# Patient Record
Sex: Male | Born: 2000 | Race: Black or African American | Hispanic: No | Marital: Single | Smoking: Never smoker
Health system: Southern US, Community
[De-identification: ages and names within clinical notes are randomized; demographics above are authoritative.]

## PROBLEM LIST (undated history)

## (undated) ENCOUNTER — Emergency Department (HOSPITAL_COMMUNITY): Admission: EM | Payer: Medicaid Other | Source: Home / Self Care

---
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
## 2020-01-10 ENCOUNTER — Ambulatory Visit: Payer: Medicaid Other | Admitting: Family Medicine

## 2020-01-22 ENCOUNTER — Ambulatory Visit: Payer: Medicaid Other | Admitting: Family Medicine

## 2020-05-17 ENCOUNTER — Encounter: Payer: Self-pay | Admitting: Family Medicine

## 2020-05-17 ENCOUNTER — Ambulatory Visit (INDEPENDENT_AMBULATORY_CARE_PROVIDER_SITE_OTHER): Payer: Medicaid Other | Admitting: Family Medicine

## 2020-05-17 ENCOUNTER — Other Ambulatory Visit: Payer: Self-pay

## 2020-05-17 VITALS — BP 100/65 | HR 64 | Ht 67.0 in | Wt 166.0 lb

## 2020-05-17 DIAGNOSIS — Z0000 Encounter for general adult medical examination without abnormal findings: Secondary | ICD-10-CM | POA: Diagnosis not present

## 2020-05-17 DIAGNOSIS — Z1159 Encounter for screening for other viral diseases: Secondary | ICD-10-CM

## 2020-05-17 DIAGNOSIS — J4520 Mild intermittent asthma, uncomplicated: Secondary | ICD-10-CM

## 2020-05-17 DIAGNOSIS — J302 Other seasonal allergic rhinitis: Secondary | ICD-10-CM

## 2020-05-17 DIAGNOSIS — Z114 Encounter for screening for human immunodeficiency virus [HIV]: Secondary | ICD-10-CM | POA: Diagnosis not present

## 2020-05-17 MED ORDER — FLUTICASONE PROPIONATE 50 MCG/ACT NA SUSP: 2.0000 | Freq: Every day | NASAL | 2 refills | Status: DC

## 2020-05-17 MED ORDER — MONTELUKAST SODIUM 10 MG PO TABS: 10.0000 mg | ORAL_TABLET | Freq: Every day | ORAL | 3 refills | Status: DC

## 2020-05-17 MED ORDER — ALBUTEROL SULFATE HFA 108 (90 BASE) MCG/ACT IN AERS: 2.0000 | INHALATION_SPRAY | Freq: Four times a day (QID) | RESPIRATORY_TRACT | 1 refills | Status: DC | PRN

## 2020-05-17 NOTE — Patient Instructions (Signed)
It was a pleasure to see you today!  Thank you for choosing [facility] for your primary care.  Philip Joyce was seen for seasonal allergies and medication refills .   Our plans for today were:  I have refilled your medications for your allergies. Please use daily to help control your symptoms.   I have refilled your albuterol to be used as needed. I am glad to hear your asthma symptoms have improved.   To keep you healthy, please keep in mind the following health maintenance items that you are due for:   1. Tetanus booster vaccine    You should return to our clinic as needed.   Best Wishes,   Dr. Neita Garnet     Allergic Rhinitis, Adult Allergic rhinitis is a reaction to allergens in the air. Allergens are tiny specks (particles) in the air that cause your body to have an allergic reaction. This condition cannot be passed from person to person (is not contagious). Allergic rhinitis cannot be cured, but it can be controlled. There are two types of allergic rhinitis:  Seasonal. This type is also called hay fever. It happens only during certain times of the year.  Perennial. This type can happen at any time of the year. What are the causes? This condition may be caused by:  Pollen from grasses, trees, and weeds.  House dust mites.  Pet dander.  Mold. What are the signs or symptoms? Symptoms of this condition include:  Sneezing.  Runny or stuffy nose (nasal congestion).  A lot of mucus in the back of the throat (postnasal drip).  Itchy nose.  Tearing of the eyes.  Trouble sleeping.  Being sleepy during day. How is this treated? There is no cure for this condition. You should avoid things that trigger your symptoms (allergens). Treatment can help to relieve symptoms. This may include:  Medicines that block allergy symptoms, such as antihistamines. These may be given as a shot, nasal spray, or pill.  Shots that are given until your body  becomes less sensitive to the allergen (desensitization).  Stronger medicines, if all other treatments have not worked. Follow these instructions at home: Avoiding allergens   Find out what you are allergic to. Common allergens include smoke, dust, and pollen.  Avoid them if you can. These are some of the things that you can do to avoid allergens: ? Replace carpet with wood, tile, or vinyl flooring. Carpet can trap dander and dust. ? Clean any mold found in the home. ? Do not smoke. Do not allow smoking in your home. ? Change your heating and air conditioning filter at least once a month. ? During allergy season:  Keep windows closed as much as you can. If possible, use air conditioning when there is a lot of pollen in the air.  Use a special filter for allergies with your furnace and air conditioner.  Plan outdoor activities when pollen counts are lowest. This is usually during the early morning or evening hours.  If you do go outdoors when pollen count is high, wear a special mask for people with allergies.  When you come indoors, take a shower and change your clothes before sitting on furniture or bedding. General instructions  Do not use fans in your home.  Do not hang clothes outside to dry.  Wear sunglasses to keep pollen out of your eyes.  Wash your hands right away after you touch household pets.  Take over-the-counter and prescription medicines only as  told by your doctor.  Keep all follow-up visits as told by your doctor. This is important. Contact a doctor if:  You have a fever.  You have a cough that does not go away (is persistent).  You start to make whistling sounds when you breathe (wheeze).  Your symptoms do not get better with treatment.  You have thick fluid coming from your nose.  You start to have nosebleeds. Get help right away if:  Your tongue or your lips are swollen.  You have trouble breathing.  You feel dizzy or you feel like you are  going to pass out (faint).  You have cold sweats. Summary  Allergic rhinitis is a reaction to allergens in the air.  This condition may be caused by allergens. These include pollen, dust mites, pet dander, and mold.  Symptoms include a runny, itchy nose, sneezing, or tearing eyes. You may also have trouble sleeping or feel sleepy during the day.  Treatment includes taking medicines and avoiding allergens. You may also get shots or take stronger medicines.  Get help if you have a fever or a cough that does not stop. Get help right away if you are short of breath. This information is not intended to replace advice given to you by your health care provider. Make sure you discuss any questions you have with your health care provider. Document Revised: 01/24/2019 Document Reviewed: 04/26/2018 Elsevier Patient Education  2020 [employer].

## 2020-05-17 NOTE — Progress Notes (Signed)
    SUBJECTIVE:   CHIEF COMPLAINT / HPI: allergies   Seasonal Allergies Patient states that he is experiencing an increase of symptoms of his normal seasonal allergies.  He reports that since the springtime, he has been having itchy eyes, increased nasal congestion and some sneezing.  He reports that he has not been taking allergy medications as previously prescribed.  He reports that he usually uses this medication when he has symptoms.  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  Asthma, mild, intermittent Patient reports that his asthma has been well controlled.  He reports that he normally uses albuterol inhaler as needed when he is planning to do vigorous exercise.  Patient denies any shortness of breath, chest pain, or cough at this time.  Health maintenance Counseled patient on screening test for hepatitis C and HIV.  Patient was agreeable to the screening test.  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 Rhythm is regular. No murmurs or rubs. Pulm: Clear to auscultation bilaterally, no crackles, wheezing, or diminished breath sounds. Normal respiratory effort Abdomen: Bowel sounds normal. Abdomen soft and non-tender.   ASSESSMENT/PLAN:   Asthma, well controlled, mild intermittent Refill prescription for albuterol.  Patient denies nighttime cough or need for albuterol outside of intense exercise.  Patient denies any recent emergency department visit for asthma symptoms.  Does not have any chest pain or shortness of breath during his visit -Patient to continue as  needed albuterol  Seasonal allergies Symptoms consistent with seasonal allergies given patient's report of timing of symptoms. -Refill Flonase -Refill Singulair prescription  Healthcare maintenance -Hepatitis C -HIV screening   Patient to follow-up as needed  , MD [facility]

## 2020-05-18 LAB — HIV ANTIBODY (ROUTINE TESTING W REFLEX): HIV Screen 4th Generation wRfx: NONREACTIVE

## 2020-05-18 LAB — HEPATITIS C ANTIBODY: Hep C Virus Ab: <0.1 s/co ratio (ref 0.0–0.9)

## 2020-05-20 ENCOUNTER — Encounter: Payer: Self-pay | Admitting: Family Medicine

## 2020-05-20 DIAGNOSIS — Z0000 Encounter for general adult medical examination without abnormal findings: Secondary | ICD-10-CM | POA: Insufficient documentation

## 2020-05-20 DIAGNOSIS — J302 Other seasonal allergic rhinitis: Secondary | ICD-10-CM | POA: Insufficient documentation

## 2020-05-20 NOTE — Assessment & Plan Note (Signed)
Symptoms consistent with seasonal allergies given patient's report of timing of symptoms. -Refill Flonase -Refill Singulair prescription

## 2020-05-20 NOTE — Assessment & Plan Note (Signed)
-  Hepatitis C -HIV screening

## 2020-05-20 NOTE — Assessment & Plan Note (Signed)
Refill prescription for albuterol.  Patient denies nighttime cough or need for albuterol outside of intense exercise.  Patient denies any recent emergency department visit for asthma symptoms.  Does not have any chest pain or shortness of breath during his visit -Patient to continue as needed albuterol

## 2020-09-18 DIAGNOSIS — Z03818 Encounter for observation for suspected exposure to other biological agents ruled out: Secondary | ICD-10-CM | POA: Diagnosis not present

## 2020-10-28 DIAGNOSIS — Z03818 Encounter for observation for suspected exposure to other biological agents ruled out: Secondary | ICD-10-CM | POA: Diagnosis not present

## 2020-12-31 DIAGNOSIS — Z03818 Encounter for observation for suspected exposure to other biological agents ruled out: Secondary | ICD-10-CM | POA: Diagnosis not present

## 2021-06-05 DIAGNOSIS — H5213 Myopia, bilateral: Secondary | ICD-10-CM | POA: Diagnosis not present

## 2021-06-18 NOTE — Progress Notes (Deleted)
    SUBJECTIVE:   CHIEF COMPLAINT / HPI: Physical  Annual Examination   I reviewed the following patient responses on our Physical Exam Form Tobacco use  Alcohol Use  Weight  Exercise  Risk for STI  Increased family cancer risk Violence risk  PHQ9 score reviewed  Blood pressure reviewed  I considered the following items based upon USPSTF recommendations: HIV testing: Neg 2021 Hepatitis C testing: neg 2021 Cholesterol screening STI screening if high risk (Hepatitis B, Syphilis, Gonorrhea, Chlamydia) Immunizations - Influenza, Covid, Tetanus   PERTINENT  PMH / PSH:  Hidradenitis suppurativa Allergic rhinitis Asthma, mild intermittent, well-controlled  OBJECTIVE:   There were no vitals taken for this visit.  General: *** appearing stated age in no acute distress HEENT: MMM, no oral lesions noted,Neck non-tender without lymphadenopathy Cardio: Normal S1 and S2, no S3 or S4. Rhythm is regular. No murmurs or rubs.  Bilateral radial pulses palpable Pulm: Clear to auscultation bilaterally, no crackles, wheezing, or diminished breath sounds. Normal respiratory effort Abdomen: Bowel sounds normal. Abdomen soft and non-tender.  Extremities: No peripheral edema. Warm & well perfused.  Neuro: pt alert and oriented x4, follows commands, PERRLA, EOMI bilaterally  ASSESSMENT/PLAN:   No problem-specific Assessment & Plan notes found for this encounter.     Ronnald Ramp, MD [facility]

## 2021-06-19 ENCOUNTER — Encounter: Payer: Medicaid Other | Admitting: Family Medicine

## 2021-06-20 ENCOUNTER — Encounter: Payer: Self-pay | Admitting: Family Medicine

## 2021-07-09 NOTE — Progress Notes (Deleted)
    SUBJECTIVE:   CHIEF COMPLAINT / HPI: physical   Annual Examination   I reviewed the following patient responses on our Physical Exam Form Tobacco use  Alcohol Use  Weight  Exercise  Risk for STI  Increased family cancer risk Violence risk  PHQ9 score reviewed  Blood pressure reviewed  I considered the following items based upon USPSTF recommendations: HIV testing: negative 2021 Hepatitis C testing: neg in 2021 Cholesterol screening*** STI screening if high risk (Hepatitis B, Syphilis, Gonorrhea, Chlamydia) Immunizations - Influenza, Covid, Tetanus   PERTINENT  PMH / PSH:  Asthma  Seasonal allergies   OBJECTIVE:   There were no vitals taken for this visit.  Physical Exam   ASSESSMENT/PLAN:   No problem-specific Assessment & Plan notes found for this encounter.     Ronnald Ramp, MD [facility]

## 2021-07-10 ENCOUNTER — Encounter: Payer: Medicaid Other | Admitting: Family Medicine

## 2021-07-23 ENCOUNTER — Other Ambulatory Visit: Payer: Self-pay | Admitting: Family Medicine

## 2021-07-23 DIAGNOSIS — J4520 Mild intermittent asthma, uncomplicated: Secondary | ICD-10-CM

## 2022-02-09 DIAGNOSIS — R509 Fever, unspecified: Secondary | ICD-10-CM | POA: Diagnosis not present

## 2022-02-09 DIAGNOSIS — R0981 Nasal congestion: Secondary | ICD-10-CM | POA: Diagnosis not present

## 2022-02-09 DIAGNOSIS — R059 Cough, unspecified: Secondary | ICD-10-CM | POA: Diagnosis not present

## 2022-02-09 DIAGNOSIS — R112 Nausea with vomiting, unspecified: Secondary | ICD-10-CM | POA: Diagnosis not present

## 2022-12-15 ENCOUNTER — Ambulatory Visit (INDEPENDENT_AMBULATORY_CARE_PROVIDER_SITE_OTHER): Payer: Medicaid Other | Admitting: Student

## 2022-12-15 ENCOUNTER — Encounter: Payer: Self-pay | Admitting: Student

## 2022-12-15 VITALS — BP 106/64 | HR 60 | Ht 67.0 in | Wt 160.2 lb

## 2022-12-15 DIAGNOSIS — Z0000 Encounter for general adult medical examination without abnormal findings: Secondary | ICD-10-CM

## 2022-12-15 DIAGNOSIS — Z111 Encounter for screening for respiratory tuberculosis: Secondary | ICD-10-CM

## 2022-12-15 DIAGNOSIS — Z113 Encounter for screening for infections with a predominantly sexual mode of transmission: Secondary | ICD-10-CM

## 2022-12-15 DIAGNOSIS — Z23 Encounter for immunization: Secondary | ICD-10-CM | POA: Diagnosis not present

## 2022-12-15 DIAGNOSIS — Z13220 Encounter for screening for lipoid disorders: Secondary | ICD-10-CM

## 2022-12-15 NOTE — Progress Notes (Unsigned)
    SUBJECTIVE:   Chief compliant/HPI: annual examination  Philip Joyce is a 22 y.o. who presents today for an annual exam. 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 Needs health assessment.   Coaching flag football.   Family hx of diabetes in grandparents.   Walks on treadmill. Lifts weights. 3 days/week.   Reviewed and updated history ***.   Review of systems form notable for ***.    OBJECTIVE:   There were no vitals taken for this visit.  ***  ASSESSMENT/PLAN:   No problem-specific Assessment & Plan notes found for this encounter.    Annual Examination  See AVS for age appropriate recommendations  PHQ score ***, reviewed and discussed.  Blood pressure reviewed and at goal. ***   Advanced directive ***   Considered the following items based upon USPSTF recommendations: HIV testing: {not indicated/requested/declined:14582} Hepatitis C: {not indicated/requested/declined:14582} Hepatitis B: {not indicated/requested/declined:14582} Syphilis if at high risk: {{not indicated/requested/declined:14582} GC/CT{not indicated/requested/declined:14582} Lipid panel (nonfasting or fasting) discussed based upon AHA recommendations and {ordered not order:23822}.  Consider repeat every 4-6 years.  Reviewed risk factors for latent tuberculosis and {not indicated/requested/declined:14582} Immunizations ***   Follow up in 1 year or sooner if indicated.    Marnee Guarneri, MD [place]

## 2022-12-15 NOTE — Patient Instructions (Addendum)
Mr. Treighton, Mezey to meet you! We are checking some labs today. I will call you if we need to make any changes. Otherwise, I will shoot you a MyChart message.   I will have the form ready for you as soon as the TB test comes back and will message you when it is ready to pick up.  Marnee Guarneri, MD

## 2022-12-18 LAB — COMPREHENSIVE METABOLIC PANEL
ALT: 15 IU/L (ref 0–44)
AST: 15 IU/L (ref 0–40)
Albumin/Globulin Ratio: 2 (ref 1.2–2.2)
Albumin: 4.5 g/dL (ref 4.3–5.2)
Alkaline Phosphatase: 53 IU/L (ref 44–121)
BUN/Creatinine Ratio: 12 (ref 9–20)
BUN: 13 mg/dL (ref 6–20)
Bilirubin Total: <0.2 mg/dL (ref 0.0–1.2)
CO2: 23 mmol/L (ref 20–29)
Calcium: 9.3 mg/dL (ref 8.7–10.2)
Chloride: 103 mmol/L (ref 96–106)
Creatinine, Ser: 1.06 mg/dL (ref 0.76–1.27)
Globulin, Total: 2.3 g/dL (ref 1.5–4.5)
Glucose: 83 mg/dL (ref 70–99)
Potassium: 4.2 mmol/L (ref 3.5–5.2)
Sodium: 140 mmol/L (ref 134–144)
Total Protein: 6.8 g/dL (ref 6.0–8.5)
eGFR: 102 mL/min/{1.73_m2} (ref >59)

## 2022-12-18 LAB — RPR: RPR Ser Ql: NONREACTIVE

## 2022-12-18 LAB — HCV AB W REFLEX TO QUANT PCR: HCV Ab: NONREACTIVE

## 2022-12-18 LAB — QUANTIFERON-TB GOLD PLUS
QuantiFERON Mitogen Value: >10 IU/mL
QuantiFERON Nil Value: 0.07 IU/mL
QuantiFERON TB1 Ag Value: 0.05 IU/mL
QuantiFERON TB2 Ag Value: 0.05 IU/mL
QuantiFERON-TB Gold Plus: NEGATIVE

## 2022-12-18 LAB — LDL CHOLESTEROL, DIRECT: LDL Direct: 64 mg/dL (ref 0–99)

## 2022-12-18 LAB — HCV INTERPRETATION

## 2022-12-18 LAB — HIV ANTIBODY (ROUTINE TESTING W REFLEX): HIV Screen 4th Generation wRfx: NONREACTIVE

## 2022-12-21 ENCOUNTER — Other Ambulatory Visit: Payer: Medicaid Other

## 2022-12-21 ENCOUNTER — Other Ambulatory Visit (HOSPITAL_COMMUNITY)
Admission: RE | Admit: 2022-12-21 | Discharge: 2022-12-21 | Disposition: A | Discharge status: Home / Self Care | Payer: Medicaid Other | Source: Ambulatory Visit | Attending: Family Medicine | Admitting: Family Medicine

## 2022-12-21 DIAGNOSIS — Z113 Encounter for screening for infections with a predominantly sexual mode of transmission: Secondary | ICD-10-CM | POA: Diagnosis present

## 2022-12-22 ENCOUNTER — Telehealth: Payer: Self-pay

## 2022-12-22 LAB — URINE CYTOLOGY ANCILLARY ONLY
Chlamydia: NEGATIVE
Comment: NEGATIVE
Comment: NORMAL
Neisseria Gonorrhea: NEGATIVE

## 2022-12-22 NOTE — Telephone Encounter (Signed)
Called patient and informed him that his form is ready for pickup.  Ozella Almond, [place]

## 2022-12-22 NOTE — Telephone Encounter (Signed)
Form found in RN box.   Placed up front for pick up.   Copy made for batch scanning.   Attempted to call patient, however no answer.

## 2022-12-22 NOTE — Telephone Encounter (Signed)
-----   Message from Eppie Gibson, MD sent at 12/21/2022  8:40 PM EST ----- Regarding: RE: Form Form is complete and was placed in the RN box over the weekend. Thanks!   ----- Message ----- From: Ozella Almond, CMA Sent: 12/21/2022   2:36 PM EST To: Eppie Gibson, MD Subject: Hulen Luster,  Patient presented today to complete his urine cytology.  He asked about his Heath Assessment form.  Patient can not start his job until he has turned that in.  I looked in your box and in his letter tab as well.  Not sure if you have the form with you.  Patient is urgent to get form.   Please call patient or let me know when form is ready for pick up so that I can call patient.  Thanks,  [employer]

## 2024-01-13 ENCOUNTER — Telehealth: Payer: Self-pay

## 2024-01-13 ENCOUNTER — Ambulatory Visit
Admission: EM | Admit: 2024-01-13 | Discharge: 2024-01-13 | Disposition: A | Discharge status: Home / Self Care | Attending: Family Medicine | Admitting: Family Medicine

## 2024-01-13 ENCOUNTER — Ambulatory Visit (INDEPENDENT_AMBULATORY_CARE_PROVIDER_SITE_OTHER): Admitting: Radiology

## 2024-01-13 DIAGNOSIS — R051 Acute cough: Secondary | ICD-10-CM

## 2024-01-13 DIAGNOSIS — J09X2 Influenza due to identified novel influenza A virus with other respiratory manifestations: Secondary | ICD-10-CM | POA: Diagnosis not present

## 2024-01-13 DIAGNOSIS — R062 Wheezing: Secondary | ICD-10-CM

## 2024-01-13 LAB — POC COVID19/FLU A&B COMBO
Covid Antigen, POC: NEGATIVE
Influenza A Antigen, POC: POSITIVE — AB
Influenza B Antigen, POC: NEGATIVE

## 2024-01-13 MED ORDER — PROMETHAZINE-DM 6.25-15 MG/5ML PO SYRP: 5.0000 mL | ORAL_SOLUTION | Freq: Four times a day (QID) | ORAL | 0 refills | Status: AC | PRN

## 2024-01-13 MED ORDER — ALBUTEROL SULFATE HFA 108 (90 BASE) MCG/ACT IN AERS: 2.0000 | INHALATION_SPRAY | Freq: Four times a day (QID) | RESPIRATORY_TRACT | 0 refills | Status: AC | PRN

## 2024-01-13 NOTE — ED Triage Notes (Signed)
 Pt states fever,body aches and vomiting for the past week.

## 2024-01-13 NOTE — Telephone Encounter (Signed)
 Patient LVM on nurse line this morning requesting a note for work.   He reports he has been sick for the past week and needs a work note.   He reports flu like symptoms with nausea and vomiting.   It appears patient went to UC this morning and tested positive for Flu A.  A work note was provided by them.   I called patient and nothing further needed at this time.

## 2024-01-13 NOTE — ED Provider Notes (Signed)
 [facility]    CSN: [id number] Arrival date & time: 01/13/24  0945      History   Chief Complaint Chief Complaint  Patient presents with   Fever    HPI Philip Joyce is a 23 y.o. male.    Fever Associated symptoms: chills, congestion, cough, headaches, nausea, rhinorrhea and vomiting   Associated symptoms: no chest pain, no diarrhea and no sore throat   Not feeling well for 3 days symptoms include body aches, fatigue, subjective fever, rhinorrhea, nasal congestion, cough, decreased appetite and vomiting.  Admits wheezing and back pain.  Denies diarrhea, abdominal pain rashes or skin changes, ear pain, sore throat.  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  Denies recent travel. Is had asthma in the past but has not needed to use his inhaler in a long time.  History reviewed. No pertinent past medical history.  Patient Active Problem List   Diagnosis Date Noted   Seasonal allergies 05/20/2020   Healthcare maintenance 05/20/2020   Epigastric pain 10/04/2018   Screening for sickle-cell disease or trait 03/03/2018   Hidradenitis 03/24/2016   Otitis, externa, infective 03/24/2016   Head lump 10/01/2014   Rash and nonspecific skin eruption 01/10/2014   Asthma, well controlled, mild intermittent 08/09/2008   Allergic rhinitis 06/22/2007   Eczema 12/16/2006    History reviewed. No pertinent surgical history.     Home Medications    Prior to Admission medications   Not on File    Family History History reviewed. No pertinent family history.  Social History Social History   Tobacco Use   Smoking status: Never   Smokeless tobacco: Never  Vaping Use   Vaping status: Every Day  Substance Use Topics   Alcohol use: Yes    Alcohol/week: 3.0 standard drinks of alcohol    Types: 3 Shots of liquor per week    Comment: per month    Drug use: Yes    Frequency: 7.0 times per week    Types: Marijuana     Allergies   Patient has no  known allergies.   Review of Systems Review of Systems  Constitutional:  Positive for appetite change, chills, fatigue and fever.  HENT:  Positive for congestion and rhinorrhea. Negative for sore throat.   Respiratory:  Positive for cough. Negative for shortness of breath and wheezing.   Cardiovascular:  Negative for chest pain.  Gastrointestinal:  Positive for nausea and vomiting. Negative for diarrhea.  Neurological:  Positive for headaches.     Physical Exam Triage Vital Signs ED Triage Vitals  Encounter Vitals Group     BP 01/13/24 0958 125/82     Systolic BP Percentile --      Diastolic BP Percentile --      Pulse Rate 01/13/24 0958 71     Resp 01/13/24 0958 18     Temp 01/13/24 0958 98.2 F (36.8 C)     Temp Source 01/13/24 0958 Oral     SpO2 01/13/24 0958 96 %     Weight 01/13/24 0958 160 lb (72.6 kg)     Height 01/13/24 0958 5\' 7"  (1.702 m)     Head Circumference --      Peak Flow --      Pain Score 01/13/24 1003 0     Pain Loc --      Pain Education --      Exclude from Growth Chart --    No data found.  Updated  Vital Signs BP 125/82 (BP Location: Right Arm)   Pulse 71   Temp 98.2 F (36.8 C) (Oral)   Resp 18   Ht 5\' 7"  (1.702 m)   Wt 160 lb (72.6 kg)   SpO2 96%   BMI 25.06 kg/m   Visual Acuity Right Eye Distance:   Left Eye Distance:   Bilateral Distance:    Right Eye Near:   Left Eye Near:    Bilateral Near:     Physical Exam Vitals and nursing note reviewed.  Constitutional:      Appearance: He is not ill-appearing.  HENT:     Head: Normocephalic and atraumatic.     Right Ear: There is impacted cerumen.     Left Ear: There is impacted cerumen.     Nose: Congestion present. No rhinorrhea.     Mouth/Throat:     Mouth: Mucous membranes are moist.     Pharynx: Oropharynx is clear. No oropharyngeal exudate or posterior oropharyngeal erythema.  Eyes:     General:        Right eye: No discharge.        Left eye: No discharge.      Conjunctiva/sclera: Conjunctivae normal.  Cardiovascular:     Rate and Rhythm: Normal rate and regular rhythm.  Pulmonary:     Effort: Pulmonary effort is normal.     Breath sounds: Wheezing present.  Abdominal:     Palpations: Abdomen is soft.     Tenderness: There is no abdominal tenderness.  Musculoskeletal:     Cervical back: Neck supple.  Lymphadenopathy:     Cervical: No cervical adenopathy.  Neurological:     Mental Status: He is alert and oriented to person, place, and time.      UC Treatments / Results  Labs (all labs ordered are listed, but only abnormal results are displayed) Labs Reviewed  POC COVID19/FLU A&B COMBO    EKG   Radiology No results found.  Procedures Procedures (including critical care time)  Medications Ordered in UC Medications - No data to display  Initial Impression / Assessment and Plan / UC Course  I have reviewed the triage vital signs and the nursing notes.  Pertinent labs & imaging results that were available during my care of the patient were reviewed by me and considered in my medical decision making (see chart for details).     23 year old male with flulike symptoms for 3 days, has some wheezing on exam.  Has a history of asthma but does not need to use inhaler regularly.  Point-of-care COVID is negative, point-of-care influenza positive for influenza A.  Is outside the window for treatment with Tamiflu, will Rx inhaler and cough meds Final Clinical Impressions(s) / UC Diagnoses   Final diagnoses:  Acute cough   Discharge Instructions   None    ED Prescriptions   None    PDMP not reviewed this encounter.   Meliton Rattan, [place] 01/13/24 1046

## 2024-03-28 ENCOUNTER — Encounter: Payer: Self-pay | Admitting: *Deleted
